# Patient Record
Sex: Female | Born: 1970 | Race: White | Hispanic: No | Marital: Married | Smoking: Never smoker
Health system: Southern US, Community
[De-identification: ages and names within clinical notes are randomized; demographics above are authoritative.]

## PROBLEM LIST (undated history)

## (undated) DIAGNOSIS — E039 Hypothyroidism, unspecified: Secondary | ICD-10-CM

## (undated) HISTORY — DX: Hypothyroidism, unspecified: E03.9

---

## 2001-10-05 ENCOUNTER — Other Ambulatory Visit: Admission: RE | Admit: 2001-10-05 | Discharge: 2001-10-05 | Payer: Self-pay | Admitting: Emergency Medicine

## 2004-08-01 ENCOUNTER — Inpatient Hospital Stay (HOSPITAL_COMMUNITY): Admission: AD | Admit: 2004-08-01 | Discharge: 2004-08-04 | Payer: Self-pay | Admitting: Obstetrics and Gynecology

## 2007-07-31 ENCOUNTER — Inpatient Hospital Stay (HOSPITAL_COMMUNITY): Admission: AD | Admit: 2007-07-31 | Discharge: 2007-08-02 | Payer: Self-pay | Admitting: Obstetrics

## 2007-08-01 ENCOUNTER — Encounter (INDEPENDENT_AMBULATORY_CARE_PROVIDER_SITE_OTHER): Payer: Self-pay | Admitting: Obstetrics

## 2010-10-03 NOTE — H&P (Signed)
NAME:  Debbie Cohen, Debbie Cohen NO.:  [id number]   MEDICAL RECORD NO.:  [id number]           PATIENT TYPE:   LOCATION:                                 FACILITY:   PHYSICIAN:  Richardean Sale, M.D.   DATE OF BIRTH:  1970-06-04   DATE OF ADMISSION:  08/01/2004  DATE OF DISCHARGE:                                HISTORY & PHYSICAL   ADMITTING DIAGNOSIS:  A 40+ week intrauterine pregnancy with spontaneously  rupture of membranes.   HISTORY OF PRESENT ILLNESS:  This is a 40 year old, gravida 1, para 0, white  female at 40 weeks and 2 days gestation by first trimester ultrasound, who  noticed a small gush of clear, watery fluid on the evening of July 31, 2004.  She noticed a second gush the morning of August 01, 2004, and was  having irregular contractions.  She denies any vaginal bleeding and reports  movement.  Sterile speculum exam in the office, positive nitrazine, positive  fern test, consistent with spontaneous rupture of membranes.  Prenatal care  has been at [facility] with Dr. Maxie Better as the primary  attending.   PAST OBSTETRIC HISTORY:  Gravida 1, para 0.   GYNECOLOGIC HISTORY:  No abnormal Pap smears or sexually transmitted  infections.   PAST MEDICAL HISTORY:  No prior hospitalization.   PAST SURGICAL HISTORY:  None.   SOCIAL HISTORY:  Denies tobacco, alcohol, or drugs, is married.   FAMILY HISTORY:  Significant for diabetes.  No known birth defects,  congenital anomalies, trisomies, or cystic fibrosis.   PRENATAL LABS:  Blood type O positive, antibody screen negative, RPR  nonreactive, rubella immune, hepatitis B surface antigen nonreactive, HIV  nonreactive, Pap test within normal limits.  Gonorrhea and Chlamydia screens  negative.  First trimester screen within normal limits.  AFP within normal  limits.  One hour Glucola normal.  Group B beta strep negative.   PHYSICAL EXAMINATION:  VITAL SIGNS:  Afebrile.  Vital signs stable.  Fetal  heart tones in the 140s.  GENERAL:  She is a well-developed, well-nourished white female in no acute  distress.  HEART:  Regular rate and rhythm.  LUNGS:  Clear to auscultation bilaterally.  ABDOMEN:  Gravid, soft, nontender.  Fundal height appropriate for  gestational age.  EXTREMITIES:  Trace edema bilaterally.  No cyanosis or clubbing.  CERVIX:  2 cm dilated, 60%, -1 station, vertex.   ASSESSMENT:  A 40+ week intrauterine pregnancy with spontaneous rupture of  membranes.   PLAN:  1.  Will admit to labor and delivery.  2.  Continue with fetal monitoring.  3.  Start low dose Pitocin if needed.  4.  Anticipate attempts at vaginal delivery.      JW/MEDQ  D:  08/01/2004  T:  08/01/2004  Job:  [id number]

## 2011-02-09 LAB — CBC
HCT: 37
MCHC: 34.5
MCHC: 35
MCV: 87.3
Platelets: 193
Platelets: 210
RDW: 14.2
RDW: 14.7

## 2012-04-30 ENCOUNTER — Ambulatory Visit (INDEPENDENT_AMBULATORY_CARE_PROVIDER_SITE_OTHER): Payer: BC Managed Care – PPO | Admitting: Internal Medicine

## 2012-04-30 VITALS — BP 107/64 | HR 56 | Temp 98.0°F | Resp 16 | Ht 64.5 in | Wt 116.0 lb

## 2012-04-30 DIAGNOSIS — E039 Hypothyroidism, unspecified: Secondary | ICD-10-CM | POA: Insufficient documentation

## 2012-04-30 DIAGNOSIS — R109 Unspecified abdominal pain: Secondary | ICD-10-CM

## 2012-04-30 DIAGNOSIS — J029 Acute pharyngitis, unspecified: Secondary | ICD-10-CM

## 2012-04-30 DIAGNOSIS — R10A0 Flank pain, unspecified side: Secondary | ICD-10-CM

## 2012-04-30 LAB — POCT RAPID STREP A (OFFICE): Rapid Strep A Screen: NEGATIVE

## 2012-04-30 LAB — POCT URINALYSIS DIPSTICK
Leukocytes, UA: NEGATIVE
Nitrite, UA: NEGATIVE
Protein, UA: NEGATIVE
Urobilinogen, UA: 1
pH, UA: 7

## 2012-04-30 LAB — POCT UA - MICROSCOPIC ONLY
Casts, Ur, LPF, POC: NEGATIVE
Yeast, UA: NEGATIVE

## 2012-04-30 NOTE — Progress Notes (Signed)
  Subjective:    Patient ID: Debbie Cohen, female    DOB: 1971-04-23, 41 y.o.   MRN: [id number]  HPI both kids with strep She is asymptomatic except for left flank pain for 24-hours-aching sensation deep in the muscle/sometimes hurts with movement She is very active and works out with a trainer in addition to run 5 miles yesterday No fever/may have had hematuria   meds for hypothyroidism on oral contraceptives      Review of Systems No fever chills No runny nose No sore throat    Objective:   Physical Exam Oropharynx clear/no nodes Mildly tender deep in the muscle over the left flank with negative percussion tenderness and negative tenderness over the iliac crest and the ribs Mild discomfort with rotation and reaching    Results for orders placed in visit on 04/30/12  POCT RAPID STREP A (OFFICE)      Component Value Range   Rapid Strep A Screen Negative  Negative  POCT URINALYSIS DIPSTICK      Component Value Range   Color, UA yellow     Clarity, UA clear     Glucose, UA neg     Bilirubin, UA neg     Ketones, UA trace     Spec Grav, UA 1.020     Blood, UA neg     pH, UA 7.0     Protein, UA neg     Urobilinogen, UA 1.0     Nitrite, UA neg     Leukocytes, UA Negative    POCT UA - MICROSCOPIC ONLY      Component Value Range   WBC, Ur, HPF, POC 2-3     RBC, urine, microscopic neg     Bacteria, U Microscopic neg     Mucus, UA neg     Epithelial cells, urine per micros 2-3     Crystals, Ur, HPF, POC neg     Casts, Ur, LPF, POC neg     Yeast, UA neg          Assessment & Plan:  Problem #1 exposure to strep Problem #2 muscle strain Throat culture Range of motion exercises/heat

## 2012-05-02 LAB — CULTURE, GROUP A STREP

## 2012-09-06 ENCOUNTER — Other Ambulatory Visit: Payer: Self-pay

## 2012-09-06 DIAGNOSIS — Z1231 Encounter for screening mammogram for malignant neoplasm of breast: Secondary | ICD-10-CM

## 2012-10-12 ENCOUNTER — Ambulatory Visit
Admission: RE | Admit: 2012-10-12 | Discharge: 2012-10-12 | Disposition: A | Discharge status: Home / Self Care | Payer: BC Managed Care – PPO | Source: Ambulatory Visit

## 2012-10-12 DIAGNOSIS — Z1231 Encounter for screening mammogram for malignant neoplasm of breast: Secondary | ICD-10-CM

## 2013-09-28 ENCOUNTER — Other Ambulatory Visit: Payer: Self-pay

## 2013-09-28 DIAGNOSIS — Z1231 Encounter for screening mammogram for malignant neoplasm of breast: Secondary | ICD-10-CM

## 2013-10-13 ENCOUNTER — Encounter (INDEPENDENT_AMBULATORY_CARE_PROVIDER_SITE_OTHER): Payer: Self-pay

## 2013-10-13 ENCOUNTER — Ambulatory Visit
Admission: RE | Admit: 2013-10-13 | Discharge: 2013-10-13 | Disposition: A | Discharge status: Home / Self Care | Payer: BC Managed Care – PPO | Source: Ambulatory Visit

## 2013-10-13 DIAGNOSIS — Z1231 Encounter for screening mammogram for malignant neoplasm of breast: Secondary | ICD-10-CM

## 2014-03-09 ENCOUNTER — Ambulatory Visit (INDEPENDENT_AMBULATORY_CARE_PROVIDER_SITE_OTHER): Payer: BC Managed Care – PPO | Admitting: Physician Assistant

## 2014-03-09 VITALS — BP 108/66 | HR 65 | Temp 98.7°F | Resp 16 | Ht 64.5 in | Wt 119.8 lb

## 2014-03-09 DIAGNOSIS — J029 Acute pharyngitis, unspecified: Secondary | ICD-10-CM

## 2014-03-09 LAB — POCT RAPID STREP A (OFFICE): RAPID STREP A SCREEN: NEGATIVE

## 2014-03-09 NOTE — Patient Instructions (Signed)

## 2014-03-09 NOTE — Progress Notes (Signed)
I was directly involved with the patient's care and agree with the physical, diagnosis and treatment plan.  

## 2014-03-09 NOTE — Progress Notes (Signed)
   Subjective:    Patient ID: Debbie KempfJill Gully, female    DOB: Jul 14, 1970, 43 y.o.   MRN: [id number]  HPI Patient presents with 3 days of a scratchy throat that was accompanied with fever of 101 degrees today. Denies cough, but has sneezed some, has swollen lymph nodes, and a headache. Rest of ROS neg. Had strep throat 4 years ago. Husband is currently sick. Has only taken ibuprofen for fever and garlic and honey for her throat.   Review of Systems  Constitutional: Positive for fever (today). Negative for activity change and fatigue.  HENT: Positive for sneezing, sore throat (scratchy) and voice change. Negative for congestion, ear pain, postnasal drip, rhinorrhea and sinus pressure.   Eyes: Negative for pain and itching.  Respiratory: Negative for cough, chest tightness, shortness of breath and wheezing.   Cardiovascular: Negative for chest pain and palpitations.  Gastrointestinal: Negative for nausea, vomiting and abdominal pain.  Musculoskeletal: Negative for myalgias, neck pain and neck stiffness.  Neurological: Positive for headaches. Negative for light-headedness.  Hematological: Positive for adenopathy.       Objective:   Physical Exam  Constitutional: She is oriented to person, place, and time. She appears well-developed and well-nourished. No distress.  Blood pressure 108/66, pulse 65, temperature 98.7 F (37.1 C), temperature source Oral, resp. rate 16, height 5' 4.5" (1.638 m), weight 119 lb 12.8 oz (54.341 kg), SpO2 100.00%.   HENT:  Head: Normocephalic and atraumatic.  Right Ear: External ear normal. No drainage, swelling or tenderness. Tympanic membrane is not injected, not scarred, not perforated, not erythematous, not retracted and not bulging. No middle ear effusion. No decreased hearing is noted.  Left Ear: External ear and ear canal normal. No drainage, swelling or tenderness. Tympanic membrane is erythematous. Tympanic membrane is not injected, not scarred, not perforated,  not retracted and not bulging. A middle ear effusion (serous) is present.  Nose: Rhinorrhea (with erythema) present. No mucosal edema or sinus tenderness. Right sinus exhibits no maxillary sinus tenderness and no frontal sinus tenderness. Left sinus exhibits no maxillary sinus tenderness.  Mouth/Throat: Uvula is midline. Mucous membranes are not pale, not dry and not cyanotic. Posterior oropharyngeal edema and posterior oropharyngeal erythema present. No oropharyngeal exudate.  Hypertrophic tonsils 2+   Eyes: Conjunctivae are normal. Pupils are equal, round, and reactive to light. Right eye exhibits no discharge. Left eye exhibits no discharge.  Neck: Neck supple.  Cardiovascular: Normal rate, regular rhythm and normal heart sounds.  Exam reveals no gallop and no friction rub.   No murmur heard. Pulmonary/Chest: Effort normal and breath sounds normal. No respiratory distress. She has no wheezes. She has no rales. She exhibits no tenderness.  Abdominal: Soft. Bowel sounds are normal. There is no tenderness.  Neurological: She is alert and oriented to person, place, and time.  Skin: Skin is warm and dry. No rash noted. She is not diaphoretic. No pallor.   Results for orders placed in visit on 03/09/14  POCT RAPID STREP A (OFFICE)      Result Value Ref Range   Rapid Strep A Screen Negative  Negative       Assessment & Plan:  1. Viral pharyngitis 2. Sore throat - POCT rapid strep A - Declines [facility].  - Continue to stay hydrated and get plenty of rest.   Janan Ridgeishira Zurii Hewes PA-C  [facility] 03/09/2014 3:37 PM

## 2014-03-17 ENCOUNTER — Telehealth: Payer: Self-pay | Admitting: Family Medicine

## 2014-03-17 ENCOUNTER — Ambulatory Visit (INDEPENDENT_AMBULATORY_CARE_PROVIDER_SITE_OTHER): Payer: BC Managed Care – PPO | Admitting: Family Medicine

## 2014-03-17 ENCOUNTER — Ambulatory Visit (INDEPENDENT_AMBULATORY_CARE_PROVIDER_SITE_OTHER): Payer: BC Managed Care – PPO

## 2014-03-17 VITALS — BP 107/64 | HR 59 | Temp 98.0°F | Resp 16 | Ht 64.5 in | Wt 117.4 lb

## 2014-03-17 DIAGNOSIS — R05 Cough: Secondary | ICD-10-CM

## 2014-03-17 DIAGNOSIS — R5081 Fever presenting with conditions classified elsewhere: Secondary | ICD-10-CM

## 2014-03-17 DIAGNOSIS — R5381 Other malaise: Secondary | ICD-10-CM

## 2014-03-17 DIAGNOSIS — R059 Cough, unspecified: Secondary | ICD-10-CM

## 2014-03-17 DIAGNOSIS — J209 Acute bronchitis, unspecified: Secondary | ICD-10-CM

## 2014-03-17 LAB — POCT CBC
Granulocyte percent: 72.5 % (ref 37–80)
HCT, POC: 44.5 % (ref 37.7–47.9)
Hemoglobin: 14.8 g/dL (ref 12.2–16.2)
Lymph, poc: 1.7 (ref 0.6–3.4)
MCH, POC: 31.4 pg — AB (ref 27–31.2)
MCHC: 33.3 g/dL (ref 31.8–35.4)
MCV: 94.3 fL (ref 80–97)
MID (cbc): 0.8 (ref 0–0.9)
MPV: 6.3 fL (ref 0–99.8)
POC Granulocyte: 6.6 (ref 2–6.9)
POC LYMPH PERCENT: 18.8 % (ref 10–50)
POC MID %: 8.7 %M (ref 0–12)
Platelet Count, POC: 339 10*3/uL (ref 142–424)
RBC: 4.72 M/uL (ref 4.04–5.48)
RDW, POC: 13 %
WBC: 9.1 10*3/uL (ref 4.6–10.2)

## 2014-03-17 MED ORDER — AZITHROMYCIN 250 MG PO TABS: ORAL_TABLET | ORAL | Status: AC

## 2014-03-17 NOTE — Patient Instructions (Signed)
Acute Bronchitis °Bronchitis is inflammation of the airways that extend from the windpipe into the lungs (bronchi). The inflammation often causes mucus to develop. This leads to a cough, which is the most common symptom of bronchitis.  °In acute bronchitis, the condition usually develops suddenly and goes away over time, usually in a couple weeks. Smoking, allergies, and asthma can make bronchitis worse. Repeated episodes of bronchitis may cause further lung problems.  °CAUSES °Acute bronchitis is most often caused by the same virus that causes a cold. The virus can spread from person to person (contagious) through coughing, sneezing, and touching contaminated objects. °SIGNS AND SYMPTOMS  °· Cough.   °· Fever.   °· Coughing up mucus.   °· Body aches.   °· Chest congestion.   °· Chills.   °· Shortness of breath.   °· Sore throat.   °DIAGNOSIS  °Acute bronchitis is usually diagnosed through a physical exam. Your health care provider will also ask you questions about your medical history. Tests, such as chest X-rays, are sometimes done to rule out other conditions.  °TREATMENT  °Acute bronchitis usually goes away in a couple weeks. Oftentimes, no medical treatment is necessary. Medicines are sometimes given for relief of fever or cough. Antibiotic medicines are usually not needed but may be prescribed in certain situations. In some cases, an inhaler may be recommended to help reduce shortness of breath and control the cough. A cool mist vaporizer may also be used to help thin bronchial secretions and make it easier to clear the chest.  °HOME CARE INSTRUCTIONS °· Get plenty of rest.   °· Drink enough fluids to keep your urine clear or pale yellow (unless you have a medical condition that requires fluid restriction). Increasing fluids may help thin your respiratory secretions (sputum) and reduce chest congestion, and it will prevent dehydration.   °· Take medicines only as directed by your health care provider. °· If  you were prescribed an antibiotic medicine, finish it all even if you start to feel better. °· Avoid smoking and secondhand smoke. Exposure to cigarette smoke or irritating chemicals will make bronchitis worse. If you are a smoker, consider using nicotine gum or skin patches to help control withdrawal symptoms. Quitting smoking will help your lungs heal faster.   °· Reduce the chances of another bout of acute bronchitis by washing your hands frequently, avoiding people with cold symptoms, and trying not to touch your hands to your mouth, nose, or eyes.   °· Keep all follow-up visits as directed by your health care provider.   °SEEK MEDICAL CARE IF: °Your symptoms do not improve after 1 week of treatment.  °SEEK IMMEDIATE MEDICAL CARE IF: °· You develop an increased fever or chills.   °· You have chest pain.   °· You have severe shortness of breath. °· You have bloody sputum.   °· You develop dehydration. °· You faint or repeatedly feel like you are going to pass out. °· You develop repeated vomiting. °· You develop a severe headache. °MAKE SURE YOU:  °· Understand these instructions. °· Will watch your condition. °· Will get help right away if you are not doing well or get worse. °Document Released: 06/11/2004 Document Revised: 09/18/2013 Document Reviewed: 10/25/2012 °Exit[employer]® Patient Information ©2015 [employer]. This information is not intended to replace advice given to you by your health care provider. Make sure you discuss any questions you have with your health care provider. ° °

## 2014-03-17 NOTE — Telephone Encounter (Signed)
LM that she has PNA. She needs to take the z pack, if no improvement in 48-72 hours then call for worsening sxs. She needs repeat films in 3-4 weeks.

## 2014-03-17 NOTE — Progress Notes (Signed)
Chief Complaint:  Chief Complaint  Patient presents with  . Fever    low grade x 1 week  . Cough  . Fatigue    HPI: Debbie Cohen is a 43 y.o. female who is here for  8 day hx of cough, fatigue and also chest congestion, She feels fatigue, she is very active and has not  felt this way.  She was here on 10/23 and strep test was negative. She has 2 kids and is not feeling well. She has had intermittend fevers, low grade. No ear pain, no facial pressure.   She states her TSH was noraml abotu 3 weeks ago.   Past Medical History  Diagnosis Date  . Hypothyroidism    No past surgical history on file. History   Social History  . Marital Status: Married    Spouse Name: N/A    Number of Children: N/A  . Years of Education: N/A   Social History Main Topics  . Smoking status: Never Smoker   . Smokeless tobacco: Never Used  . Alcohol Use: 1.0 oz/week    2 drink(s) per week     Comment: social  . Drug Use: No  . Sexual Activity: Yes    Birth Control/ Protection: Pill   Other Topics Concern  . None   Social History Narrative   Family History  Problem Relation Age of Onset  . Thyroid disease Mother   . Stroke Father   . Parkinson's disease Father    No Known Allergies Prior to Admission medications   Medication Sig Start Date End Date Taking? Authorizing Provider  AMINO ACIDS COMPLEX PO Take by mouth.   Yes Historical Provider, MD  ASTRAGALUS PO Take by mouth.   Yes Historical Provider, MD  Fish Oil-Cholecalciferol (FISH OIL + D3) 1200-1000 MG-UNIT CAPS Take by mouth.   Yes Historical Provider, MD  levothyroxine (SYNTHROID) 50 MCG tablet Take 50 mcg by mouth daily.   Yes Historical Provider, MD  Multiple Vitamins-Calcium (ONE-A-DAY WOMENS PO) Take by mouth.   Yes Historical Provider, MD  Norethin Ace-Eth Estrad-FE (LOESTRIN 24 FE PO) Take 1 tablet by mouth daily.   Yes Historical Provider, MD  Probiotic Product (PROBIOTIC DAILY PO) Take by mouth.   Yes Historical  Provider, MD     ROS: The patient denies  night sweats, unintentional weight loss, chest pain, palpitations, wheezing, dyspnea on exertion, nausea, vomiting, abdominal pain, dysuria, hematuria, melena, numbness, weakness, or tingling.   All other systems have been reviewed and were otherwise negative with the exception of those mentioned in the HPI and as above.    PHYSICAL EXAM: Filed Vitals:   03/17/14 1156  BP: 107/64  Pulse: 59  Temp: 98 F (36.7 C)  Resp: 16   Filed Vitals:   03/17/14 1156  Height: 5' 4.5" (1.638 m)  Weight: 117 lb 6.4 oz (53.252 kg)   Body mass index is 19.85 kg/(m^2).  General: Alert, no acute distress HEENT:  Normocephalic, atraumatic, oropharynx patent. EOMI, PERRLA Cardiovascular:  Regular rate and rhythm, no rubs murmurs or gallops.  No Carotid bruits, radial pulse intact. No pedal edema.  Respiratory: Clear to auscultation bilaterally.  No wheezes, rales, or rhonchi.  No cyanosis, no use of accessory musculature GI: No organomegaly, abdomen is soft and non-tender, positive bowel sounds.  No masses. Skin: No rashes. Neurologic: Facial musculature symmetric. Psychiatric: Patient is appropriate throughout our interaction. Lymphatic: No cervical lymphadenopathy Musculoskeletal: Gait intact.   LABS: Results for  orders placed or performed in visit on 03/17/14  POCT CBC  Result Value Ref Range   WBC 9.1 4.6 - 10.2 K/uL   Lymph, poc 1.7 0.6 - 3.4   POC LYMPH PERCENT 18.8 10 - 50 %L   MID (cbc) 0.8 0 - 0.9   POC MID % 8.7 0 - 12 %M   POC Granulocyte 6.6 2 - 6.9   Granulocyte percent 72.5 37 - 80 %G   RBC 4.72 4.04 - 5.48 M/uL   Hemoglobin 14.8 12.2 - 16.2 g/dL   HCT, POC 16.144.5 09.637.7 - 47.9 %   MCV 94.3 80 - 97 fL   MCH, POC 31.4 (A) 27 - 31.2 pg   MCHC 33.3 31.8 - 35.4 g/dL   RDW, POC 04.513.0 %   Platelet Count, POC 339 142 - 424 K/uL   MPV 6.3 0 - 99.8 fL     EKG/XRAY:   [facility] Neg for any acute  cardiopulmonary process Increase vascualr markings  ASSESSMENT/PLAN: Encounter Diagnoses  Name Primary?  . Fever presenting with conditions classified elsewhere   . Cough   . Malaise   . Acute bronchitis, unspecified organism Yes   Rx Z pack prn Otc cough meds Cepachol prn  Gross sideeffects, risk and benefits, and alternatives of medications d/w patient. Patient is aware that all medications have potential sideeffects and we are unable to predict every sideeffect or drug-drug interaction that may occur.  Rockne CoonsLE, Guynell Kleiber PHUONG, DO 03/19/2014 6:46 PM    Spoke with patient about xray and recommend she takes abx based on sxs and also chest xray findings. Although the radiograph showed PNA ,  her WBC is WNL.   FINDINGS: Cardiac shadow is within normal limits. The lungs are well aerated bilaterally. Some patchy infiltrate is noted in the left mid upper lung projecting in the posterior aspect of the left upper lobe. No bony abnormality is noted.  IMPRESSION: Left upper lobe pneumonia. Followup films following appropriate therapy recommended.   Electronically Signed  By: Alcide CleverMark Lukens M.D.  On: 03/17/2014 15:19

## 2014-10-11 ENCOUNTER — Other Ambulatory Visit: Payer: Self-pay

## 2014-10-11 DIAGNOSIS — Z1231 Encounter for screening mammogram for malignant neoplasm of breast: Secondary | ICD-10-CM

## 2014-10-18 ENCOUNTER — Ambulatory Visit
Admission: RE | Admit: 2014-10-18 | Discharge: 2014-10-18 | Disposition: A | Discharge status: Home / Self Care | Payer: BLUE CROSS/BLUE SHIELD | Source: Ambulatory Visit

## 2014-10-18 DIAGNOSIS — Z1231 Encounter for screening mammogram for malignant neoplasm of breast: Secondary | ICD-10-CM

## 2014-10-29 ENCOUNTER — Other Ambulatory Visit: Payer: Self-pay | Admitting: Obstetrics and Gynecology

## 2014-10-29 DIAGNOSIS — N63 Unspecified lump in unspecified breast: Secondary | ICD-10-CM

## 2014-11-01 ENCOUNTER — Ambulatory Visit
Admission: RE | Admit: 2014-11-01 | Discharge: 2014-11-01 | Disposition: A | Discharge status: Home / Self Care | Payer: BLUE CROSS/BLUE SHIELD | Source: Ambulatory Visit | Attending: Obstetrics and Gynecology | Admitting: Obstetrics and Gynecology

## 2014-11-01 ENCOUNTER — Other Ambulatory Visit: Payer: Self-pay | Admitting: Obstetrics and Gynecology

## 2014-11-01 DIAGNOSIS — N63 Unspecified lump in unspecified breast: Secondary | ICD-10-CM

## 2015-06-15 ENCOUNTER — Ambulatory Visit (INDEPENDENT_AMBULATORY_CARE_PROVIDER_SITE_OTHER): Payer: BLUE CROSS/BLUE SHIELD | Admitting: Internal Medicine

## 2015-06-15 VITALS — BP 110/82 | HR 66 | Temp 98.5°F | Resp 18 | Ht 65.35 in | Wt 120.2 lb

## 2015-06-15 DIAGNOSIS — J029 Acute pharyngitis, unspecified: Secondary | ICD-10-CM | POA: Diagnosis not present

## 2015-06-15 DIAGNOSIS — J101 Influenza due to other identified influenza virus with other respiratory manifestations: Secondary | ICD-10-CM | POA: Diagnosis not present

## 2015-06-15 DIAGNOSIS — R509 Fever, unspecified: Secondary | ICD-10-CM | POA: Diagnosis not present

## 2015-06-15 LAB — POCT RAPID STREP A (OFFICE): RAPID STREP A SCREEN: NEGATIVE

## 2015-06-15 LAB — POCT INFLUENZA A/B
INFLUENZA A, POC: POSITIVE — AB
Influenza B, POC: NEGATIVE

## 2015-06-15 NOTE — Progress Notes (Signed)
Subjective:  This chart was scribed for Ellamae Sia, MD by [facility], medical scribe at [facility].The patient was seen in exam room 08 and the patient's care was started at 9:37 AM.   Patient ID: Debbie Cohen, female    DOB: Oct 21, 1970, 45 y.o.   MRN: [id number] Chief Complaint  Patient presents with  . Sore Throat    yesterday  . Nasal Congestion  . Fever   HPI HPI Comments: Debbie Cohen is a 45 y.o. female who presents to [facility] due to a fever which began yesterday. Highest measured temp is 101. Associated nasal congestion and sore throat. No body aches or cough. Sick contacts at home, daughter has the flu.  Past Medical History  Diagnosis Date  . Hypothyroidism    Prior to Admission medications   Medication Sig Start Date End Date Taking? Authorizing Provider  AMINO ACIDS COMPLEX PO Take by mouth.   Yes Historical Provider, MD  ASTRAGALUS PO Take by mouth.   Yes Historical Provider, MD  azithromycin (ZITHROMAX) 250 MG tablet Take 2 tabs po now then 1 tab po daily 03/17/14  Yes Thao P Le, DO  Fish Oil-Cholecalciferol (FISH OIL + D3) 1200-1000 MG-UNIT CAPS Take by mouth.   Yes Historical Provider, MD  levothyroxine (SYNTHROID) 50 MCG tablet Take 50 mcg by mouth daily.   Yes Historical Provider, MD  Multiple Vitamins-Calcium (ONE-A-DAY WOMENS PO) Take by mouth.   Yes Historical Provider, MD  Norethin Ace-Eth Estrad-FE (LOESTRIN 24 FE PO) Take 1 tablet by mouth daily.   Yes Historical Provider, MD  Probiotic Product (PROBIOTIC DAILY PO) Take by mouth.   Yes Historical Provider, MD   No Known Allergies  Review of Systems  Constitutional: Positive for fever.  HENT: Positive for congestion and sore throat.   Respiratory: Negative for cough.   Musculoskeletal: Negative for myalgias.      Objective:  BP 110/82 mmHg  Pulse 66  Temp(Src) 98.5 F (36.9 C) (Oral)  Resp 18  Ht 5' 5.35" (1.66 m)  Wt 120 lb 3.2 oz (54.522 kg)   BMI 19.79 kg/m2  SpO2 99% Physical Exam  Constitutional: She is oriented to person, place, and time. She appears well-developed and well-nourished. No distress.  HENT:  Head: Normocephalic and atraumatic.  Right Ear: Tympanic membrane normal.  Left Ear: Tympanic membrane normal.  Mouth/Throat: Oropharynx is clear and moist. No oropharyngeal exudate.  Nose has clear rhinorrhea.  Eyes: Conjunctivae are normal. Pupils are equal, round, and reactive to light.  Neck: Normal range of motion.  Cardiovascular: Normal rate and regular rhythm.   Pulmonary/Chest: Effort normal and breath sounds normal. No respiratory distress. She has no wheezes.  Musculoskeletal: Normal range of motion.  Neurological: She is alert and oriented to person, place, and time.  Skin: Skin is warm and dry.  Psychiatric: She has a normal mood and affect. Her behavior is normal.  Nursing note and vitals reviewed. BP 110/82 mmHg  Pulse 66  Temp(Src) 98.5 F (36.9 C) (Oral)  Resp 18  Ht 5' 5.35" (1.66 m)  Wt 120 lb 3.2 oz (54.522 kg)  BMI 19.79 kg/m2  SpO2 99% Results for orders placed or performed in visit on 06/15/15  POCT rapid strep A  Result Value Ref Range   Rapid Strep A Screen Negative Negative  POCT Influenza A/B  Result Value Ref Range   Influenza A, POC Positive (A) Negative   Influenza B, POC Negative Negative  Assessment & Plan:  Influenza A  Fever, unspecified fever cause - Plan: POCT rapid strep A, POCT Influenza A/B  Sore throat  Waives meds Self care Avoid passing  By signing my name below, I, Nadim Abuhashem, attest that this documentation has been prepared under the direction and in the presence of Ellamae Sia, MD.  Electronically Signed: Conchita Paris, medical scribe. 06/15/2015, 9:44 AM.

## 2015-10-07 ENCOUNTER — Other Ambulatory Visit: Payer: Self-pay

## 2015-10-07 DIAGNOSIS — Z1231 Encounter for screening mammogram for malignant neoplasm of breast: Secondary | ICD-10-CM

## 2015-10-22 ENCOUNTER — Ambulatory Visit
Admission: RE | Admit: 2015-10-22 | Discharge: 2015-10-22 | Disposition: A | Discharge status: Home / Self Care | Payer: BLUE CROSS/BLUE SHIELD | Source: Ambulatory Visit

## 2015-10-22 ENCOUNTER — Other Ambulatory Visit: Payer: Self-pay | Admitting: Physician Assistant

## 2015-10-22 DIAGNOSIS — Z1231 Encounter for screening mammogram for malignant neoplasm of breast: Secondary | ICD-10-CM

## 2016-09-23 ENCOUNTER — Other Ambulatory Visit: Payer: Self-pay | Admitting: Obstetrics and Gynecology

## 2016-09-23 DIAGNOSIS — Z1231 Encounter for screening mammogram for malignant neoplasm of breast: Secondary | ICD-10-CM

## 2016-10-22 ENCOUNTER — Ambulatory Visit: Payer: BLUE CROSS/BLUE SHIELD

## 2016-11-03 ENCOUNTER — Ambulatory Visit
Admission: RE | Admit: 2016-11-03 | Discharge: 2016-11-03 | Disposition: A | Discharge status: Home / Self Care | Payer: BLUE CROSS/BLUE SHIELD | Source: Ambulatory Visit | Attending: Obstetrics and Gynecology | Admitting: Obstetrics and Gynecology

## 2016-11-03 DIAGNOSIS — Z1231 Encounter for screening mammogram for malignant neoplasm of breast: Secondary | ICD-10-CM

## 2017-06-18 ENCOUNTER — Other Ambulatory Visit: Payer: Self-pay

## 2017-06-18 ENCOUNTER — Ambulatory Visit: Payer: BLUE CROSS/BLUE SHIELD | Admitting: Family Medicine

## 2017-06-18 ENCOUNTER — Encounter: Payer: Self-pay | Admitting: Family Medicine

## 2017-06-18 VITALS — BP 108/64 | HR 72 | Temp 98.2°F | Ht 65.0 in | Wt 121.0 lb

## 2017-06-18 DIAGNOSIS — J020 Streptococcal pharyngitis: Secondary | ICD-10-CM

## 2017-06-18 LAB — POCT RAPID STREP A (OFFICE): Rapid Strep A Screen: POSITIVE — AB

## 2017-06-18 MED ORDER — PENICILLIN G BENZATHINE 1200000 UNIT/2ML IM SUSP
1.2000 10*6.[IU] | Freq: Once | INTRAMUSCULAR | Status: AC
  Administered 2017-06-18: 1.2 10*6.[IU] via INTRAMUSCULAR

## 2017-06-18 NOTE — Patient Instructions (Signed)
     IF you received an x-ray today, you will receive an invoice from [facility]. Please contact [facility] at [phone] with questions or concerns regarding your invoice.   IF you received labwork today, you will receive an invoice from [facility]. Please contact [facility] at [phone] with questions or concerns regarding your invoice.   Our billing staff will not be able to assist you with questions regarding bills from these companies.  You will be contacted with the lab results as soon as they are available. The fastest way to get your results is to activate your My Chart account. Instructions are located on the last page of this paperwork. If you have not heard from us regarding the results in 2 weeks, please contact this office.     

## 2017-06-18 NOTE — Progress Notes (Signed)
2/1/201911:17 AM  Debbie KempfJill Cohen 09/24/1970, 47 y.o. female [id number]  Chief Complaint  Patient presents with  . Sore Throat    thinks she may have strep throat. Children had strep throat 3 wks ago. If test comes bk +, she prefers an injection over pills    HPI:   Patient is a 47 y.o. female who presents today for 1 day of sore throat and subjective fevers. She was fine yesterday, exercised, worked, ran errands and today woke up feeling really tired, feverish and with a sore throat. Her son had back to back strep throat in December. She denies any nasal congestion, cough, ear pain, runny nose, nausea, vomiting, diarrhea.   [facility] 06/18/2017 06/15/2015  Decreased Interest 0 0  Down, Depressed, Hopeless 0 0  PHQ - 2 Score 0 0    No Known Allergies  Prior to Admission medications   Medication Sig Start Date End Date Taking? Authorizing Provider  AMINO ACIDS COMPLEX PO Take by mouth.   Yes [provider]  ASTRAGALUS PO Take by mouth.   Yes [provider]  azithromycin (ZITHROMAX) 250 MG tablet Take 2 tabs po now then 1 tab po daily 03/17/14  Yes Le, Thao P, DO  Fish Oil-Cholecalciferol (FISH OIL + D3) 1200-1000 MG-UNIT CAPS Take by mouth.   Yes [provider]  Multiple Vitamins-Calcium (ONE-A-DAY WOMENS PO) Take by mouth.   Yes [provider]  Norethin Ace-Eth Estrad-FE (LOESTRIN 24 FE PO) Take 1 tablet by mouth daily.   Yes [provider]  Probiotic Product (PROBIOTIC DAILY PO) Take by mouth.   Yes [provider]  levothyroxine (SYNTHROID) 50 MCG tablet Take 50 mcg by mouth daily.    [provider]    Past Medical History:  Diagnosis Date  . Hypothyroidism     History reviewed. No pertinent surgical history.  Social History   Tobacco Use  . Smoking status: Never Smoker  . Smokeless tobacco: Never Used  Substance Use Topics  . Alcohol use: Yes    Alcohol/week: 1.0 oz    Types: 2  drink(s) per week    Comment: social    Family History  Problem Relation Age of Onset  . Thyroid disease Mother   . Stroke Father   . Parkinson's disease Father     ROS Per hpi  OBJECTIVE:  Blood pressure 108/64, pulse 72, temperature 98.2 F (36.8 C), temperature source Oral, height 5\' 5"  (1.651 m), weight 121 lb (54.9 kg), SpO2 98 %.  Physical Exam  Constitutional: She is oriented to person, place, and time and well-developed, well-nourished, and in no distress.  HENT:  Head: Normocephalic and atraumatic.  Right Ear: Hearing, tympanic membrane, external ear and ear canal normal.  Left Ear: Hearing, tympanic membrane, external ear and ear canal normal.  Mouth/Throat: Oropharyngeal exudate and posterior oropharyngeal erythema present.  Eyes: EOM are normal. Pupils are equal, round, and reactive to light.  Neck: Neck supple.  Cardiovascular: Normal rate, regular rhythm and normal heart sounds. Exam reveals no gallop and no friction rub.  No murmur heard. Pulmonary/Chest: Effort normal and breath sounds normal. She has no wheezes. She has no rales.  Lymphadenopathy:    She has cervical adenopathy.  Neurological: She is alert and oriented to person, place, and time. Gait normal.  Skin: Skin is warm and dry.     Results for orders placed or performed in visit on 06/18/17 (from the past 24 hour(s))  POCT rapid  strep A     Status: Abnormal   Collection Time: 06/18/17 11:25 AM  Result Value Ref Range   Rapid Strep A Screen Positive (A) Negative     ASSESSMENT and PLAN  1. Streptococcal sore throat Routine supportive measures and care instructions given.  - POCT rapid strep A - penicillin g benzathine (BICILLIN LA) 1200000 UNIT/2ML injection 1.2 Million Units  Return if symptoms worsen or fail to improve.    Myles Lipps, MD Primary Care at [facility] [address] [place], [place] [address] Ph.  [phone] Fax [phone]

## 2017-11-11 ENCOUNTER — Other Ambulatory Visit: Payer: Self-pay | Admitting: Obstetrics and Gynecology

## 2017-11-11 DIAGNOSIS — Z1231 Encounter for screening mammogram for malignant neoplasm of breast: Secondary | ICD-10-CM

## 2017-12-08 ENCOUNTER — Ambulatory Visit
Admission: RE | Admit: 2017-12-08 | Discharge: 2017-12-08 | Disposition: A | Discharge status: Home / Self Care | Payer: BLUE CROSS/BLUE SHIELD | Source: Ambulatory Visit | Attending: Obstetrics and Gynecology | Admitting: Obstetrics and Gynecology

## 2017-12-08 DIAGNOSIS — Z1231 Encounter for screening mammogram for malignant neoplasm of breast: Secondary | ICD-10-CM

## 2018-11-22 ENCOUNTER — Other Ambulatory Visit: Payer: Self-pay | Admitting: Dermatology

## 2019-04-11 ENCOUNTER — Other Ambulatory Visit: Payer: Self-pay | Admitting: Obstetrics and Gynecology

## 2019-04-11 DIAGNOSIS — Z1231 Encounter for screening mammogram for malignant neoplasm of breast: Secondary | ICD-10-CM

## 2019-06-02 ENCOUNTER — Ambulatory Visit
Admission: RE | Admit: 2019-06-02 | Discharge: 2019-06-02 | Disposition: A | Discharge status: Home / Self Care | Payer: BC Managed Care – PPO | Source: Ambulatory Visit | Attending: Obstetrics and Gynecology | Admitting: Obstetrics and Gynecology

## 2019-06-02 ENCOUNTER — Other Ambulatory Visit: Payer: Self-pay

## 2019-06-02 DIAGNOSIS — Z1231 Encounter for screening mammogram for malignant neoplasm of breast: Secondary | ICD-10-CM

## 2020-05-06 IMAGING — MG DIGITAL SCREENING BILAT W/ TOMO W/ CAD
8 series · 9 of 24 positions shown · non-contrast
Comparison: Previous exam(s).

CLINICAL DATA: Screening.

EXAM:
DIGITAL SCREENING BILATERAL MAMMOGRAM WITH TOMO AND CAD

[L MLO synth-2D]
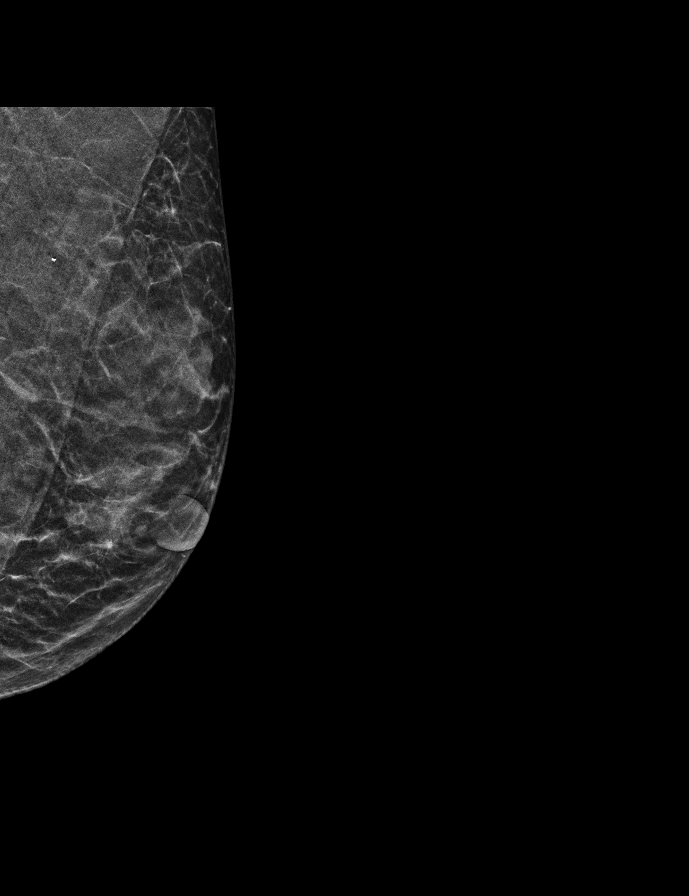

[R MLO synth-2D]
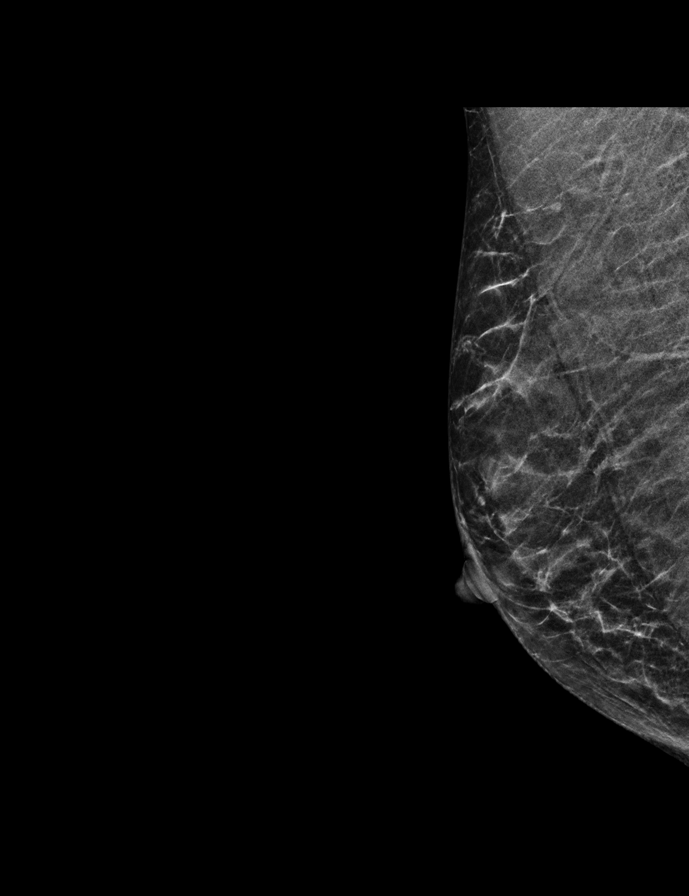

[R CC synth-2D]
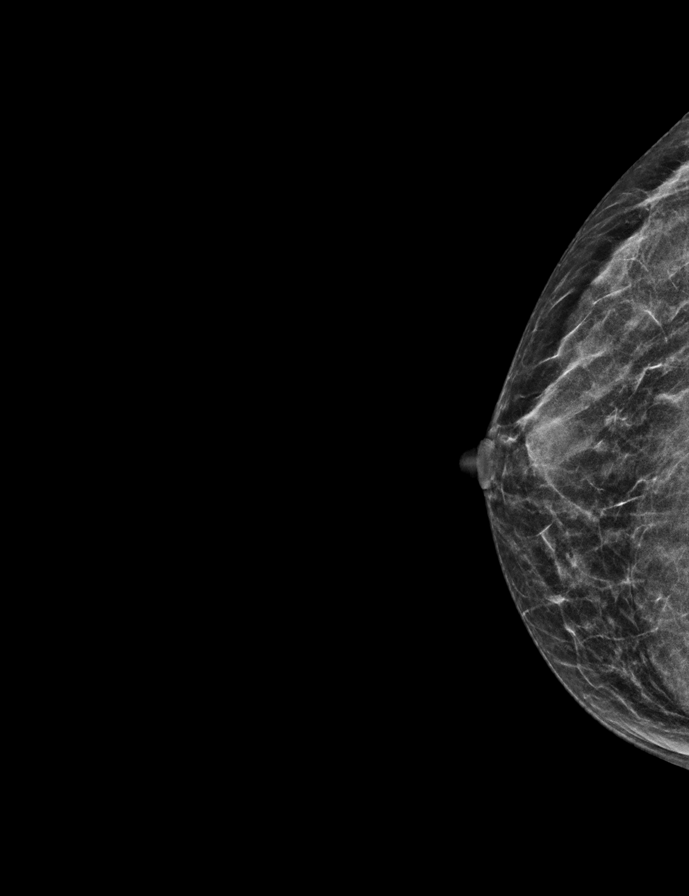

[L CC synth-2D]
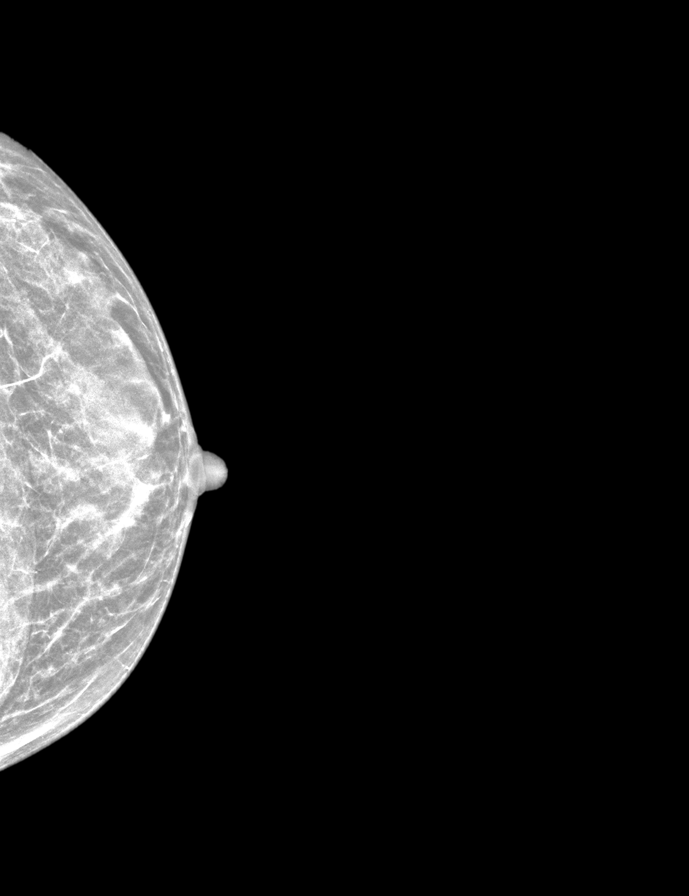

[R CC tomo · 2 of 37 frames shown]
[frame 13/37]
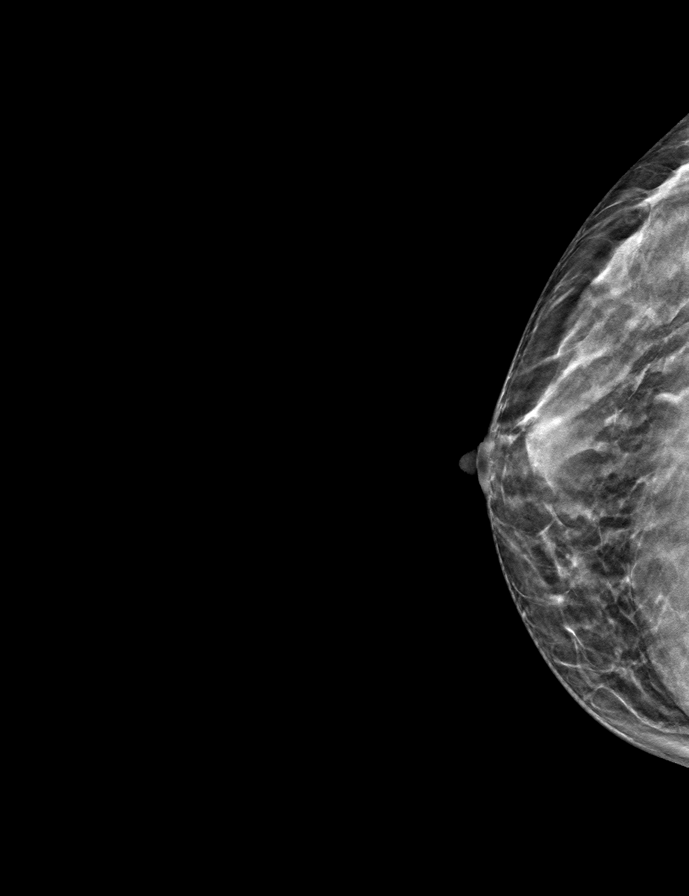
[frame 19/37]
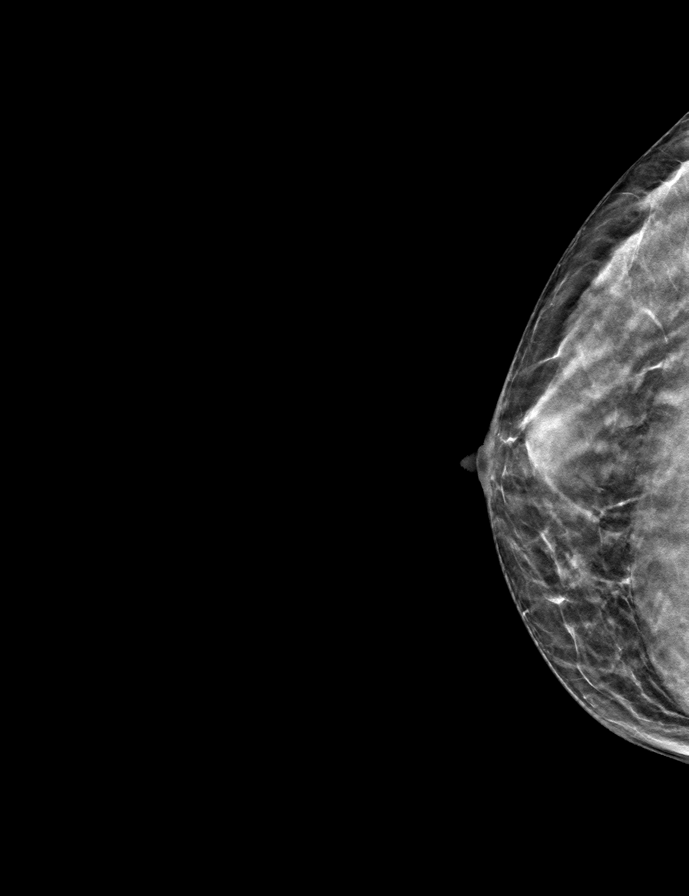

[L CC tomo · tomo slice 18/35.0]
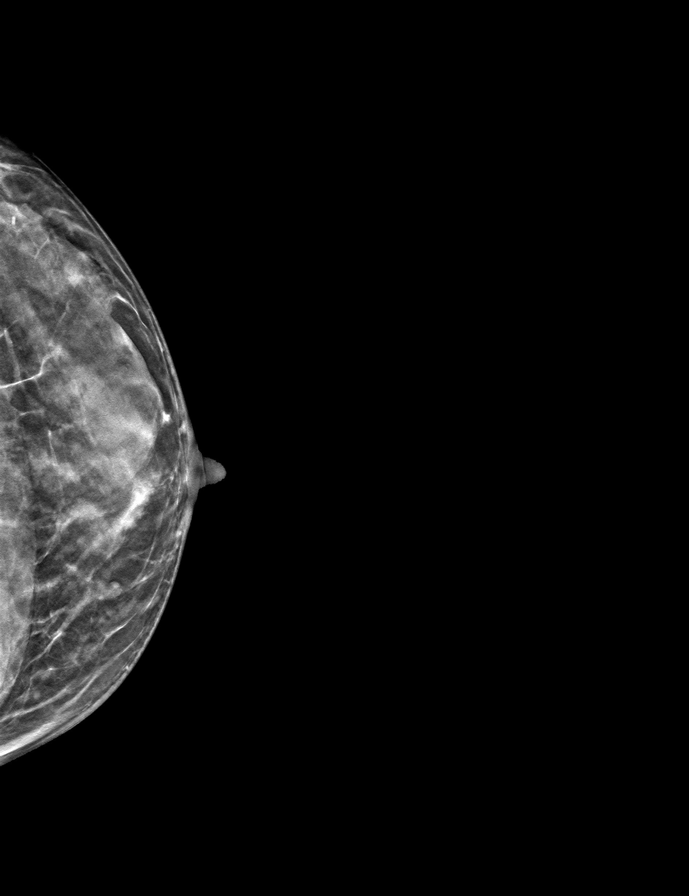

[L MLO tomo · tomo slice 17/32.0]
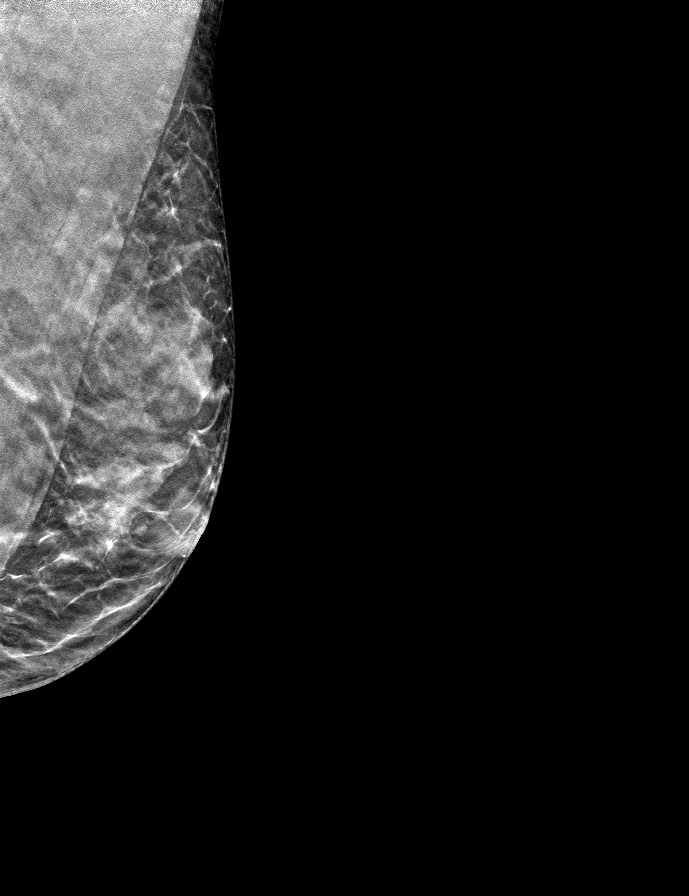

[R MLO tomo · tomo slice 18/35.0]
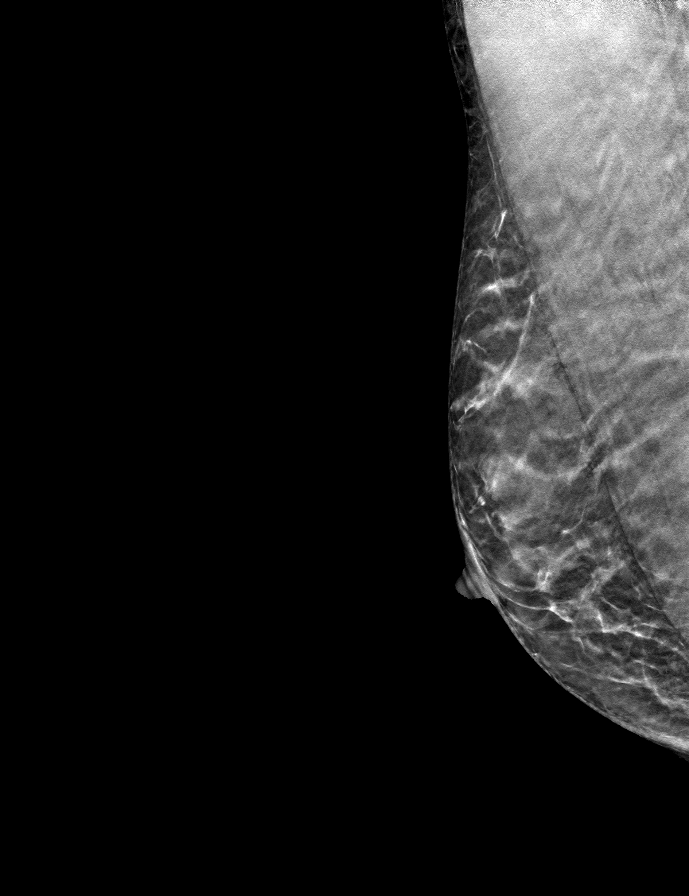

[9 of 24 positions shown; findings below may reference images not displayed]

ACR Breast Density Category c: The breast tissue is heterogeneously
dense, which may obscure small masses.
FINDINGS: There are no findings suspicious for malignancy. Images were
processed with CAD.
IMPRESSION: No mammographic evidence of malignancy. A result letter of this
screening mammogram will be mailed directly to the patient.

RECOMMENDATION:
Screening mammogram in one year. (Code:[id number]-U-LHB)

BI-RADS CATEGORY  1: Negative.

## 2020-07-31 ENCOUNTER — Other Ambulatory Visit: Payer: Self-pay | Admitting: Obstetrics and Gynecology

## 2020-07-31 DIAGNOSIS — Z1231 Encounter for screening mammogram for malignant neoplasm of breast: Secondary | ICD-10-CM

## 2020-09-23 ENCOUNTER — Other Ambulatory Visit: Payer: Self-pay

## 2020-09-23 ENCOUNTER — Ambulatory Visit
Admission: RE | Admit: 2020-09-23 | Discharge: 2020-09-23 | Disposition: A | Discharge status: Home / Self Care | Payer: BC Managed Care – PPO | Source: Ambulatory Visit | Attending: Obstetrics and Gynecology | Admitting: Obstetrics and Gynecology

## 2020-09-23 DIAGNOSIS — Z1231 Encounter for screening mammogram for malignant neoplasm of breast: Secondary | ICD-10-CM

## 2021-10-30 ENCOUNTER — Other Ambulatory Visit: Payer: Self-pay | Admitting: Obstetrics and Gynecology

## 2021-10-30 DIAGNOSIS — Z1231 Encounter for screening mammogram for malignant neoplasm of breast: Secondary | ICD-10-CM

## 2021-11-03 ENCOUNTER — Ambulatory Visit
Admission: RE | Admit: 2021-11-03 | Discharge: 2021-11-03 | Disposition: A | Discharge status: Home / Self Care | Payer: BC Managed Care – PPO | Source: Ambulatory Visit | Attending: Obstetrics and Gynecology | Admitting: Obstetrics and Gynecology

## 2021-11-03 DIAGNOSIS — Z1231 Encounter for screening mammogram for malignant neoplasm of breast: Secondary | ICD-10-CM

## 2021-12-24 ENCOUNTER — Ambulatory Visit: Payer: BC Managed Care – PPO | Admitting: Dermatology
